# Patient Record
Sex: Male | Born: 1970 | Race: White | Hispanic: No | Marital: Married | Smoking: Current every day smoker
Health system: Southern US, Community
[De-identification: ages and names within clinical notes are randomized; demographics above are authoritative.]

## PROBLEM LIST (undated history)

## (undated) HISTORY — PX: ABDOMINAL SURGERY: SHX537

## (undated) HISTORY — PX: OTHER SURGICAL HISTORY: SHX169

---

## 2015-02-10 ENCOUNTER — Emergency Department
Admission: EM | Admit: 2015-02-10 | Discharge: 2015-02-10 | Disposition: A | Discharge status: Home / Self Care | Payer: Self-pay | Attending: Student | Admitting: Student

## 2015-02-10 ENCOUNTER — Emergency Department: Payer: Self-pay

## 2015-02-10 ENCOUNTER — Encounter: Payer: Self-pay | Admitting: Emergency Medicine

## 2015-02-10 DIAGNOSIS — K029 Dental caries, unspecified: Secondary | ICD-10-CM | POA: Insufficient documentation

## 2015-02-10 DIAGNOSIS — Z720 Tobacco use: Secondary | ICD-10-CM | POA: Insufficient documentation

## 2015-02-10 DIAGNOSIS — I889 Nonspecific lymphadenitis, unspecified: Secondary | ICD-10-CM | POA: Insufficient documentation

## 2015-02-10 DIAGNOSIS — R220 Localized swelling, mass and lump, head: Secondary | ICD-10-CM | POA: Insufficient documentation

## 2015-02-10 LAB — BASIC METABOLIC PANEL
Anion gap: 11 (ref 5–15)
BUN: 19 mg/dL (ref 6–20)
CO2: 23 mmol/L (ref 22–32)
Calcium: 9.1 mg/dL (ref 8.9–10.3)
Chloride: 108 mmol/L (ref 101–111)
Creatinine, Ser: 1.05 mg/dL (ref 0.61–1.24)
GFR calc non Af Amer: >60 mL/min (ref >60)
Glucose, Bld: 99 mg/dL (ref 65–99)
Potassium: 3.7 mmol/L (ref 3.5–5.1)
SODIUM: 142 mmol/L (ref 135–145)

## 2015-02-10 LAB — CBC WITH DIFFERENTIAL/PLATELET
Basophils Absolute: 0.1 10*3/uL (ref 0–0.1)
Basophils Relative: 1 %
EOS ABS: 0.4 10*3/uL (ref 0–0.7)
EOS PCT: 3 %
HCT: 43.8 % (ref 40.0–52.0)
HEMOGLOBIN: 14 g/dL (ref 13.0–18.0)
LYMPHS PCT: 15 %
Lymphs Abs: 1.8 10*3/uL (ref 1.0–3.6)
MCH: 29.8 pg (ref 26.0–34.0)
MCHC: 31.9 g/dL — ABNORMAL LOW (ref 32.0–36.0)
MCV: 93.3 fL (ref 80.0–100.0)
MONO ABS: 0.6 10*3/uL (ref 0.2–1.0)
Monocytes Relative: 5 %
NEUTROS ABS: 9.6 10*3/uL — AB (ref 1.4–6.5)
Neutrophils Relative %: 76 %
Platelets: 224 10*3/uL (ref 150–440)
RBC: 4.7 MIL/uL (ref 4.40–5.90)
RDW: 16 % — AB (ref 11.5–14.5)
WBC: 12.5 10*3/uL — ABNORMAL HIGH (ref 3.8–10.6)

## 2015-02-10 MED ORDER — KETOROLAC TROMETHAMINE 30 MG/ML IJ SOLN
INTRAMUSCULAR | Status: AC
  Filled 2015-02-10: qty 1

## 2015-02-10 MED ORDER — CLINDAMYCIN HCL 300 MG PO CAPS: 300.0000 mg | ORAL_CAPSULE | Freq: Four times a day (QID) | ORAL | Status: AC

## 2015-02-10 MED ORDER — IOHEXOL 300 MG/ML  SOLN
75.0000 mL | Freq: Once | INTRAMUSCULAR | Status: AC | PRN
  Administered 2015-02-10: 75 mL via INTRAVENOUS
  Filled 2015-02-10: qty 75

## 2015-02-10 MED ORDER — KETOROLAC TROMETHAMINE 30 MG/ML IJ SOLN: 30.0000 mg | Freq: Once | INTRAMUSCULAR | Status: DC

## 2015-02-10 MED ORDER — CLINDAMYCIN PHOSPHATE 600 MG/50ML IV SOLN
INTRAVENOUS | Status: AC
  Filled 2015-02-10: qty 50

## 2015-02-10 MED ORDER — CLINDAMYCIN PHOSPHATE 600 MG/50ML IV SOLN: 600.0000 mg | Freq: Once | INTRAVENOUS | Status: DC

## 2015-02-10 NOTE — ED Notes (Signed)
Pt c/o pain to left jaw for the last 4-5 months that has gotten worse.  Knot in front of left ear present; appears as swollen gland.  No weight loss.

## 2015-02-10 NOTE — Discharge Instructions (Signed)
Lymphadenopathy °Lymphadenopathy means "disease of the lymph glands." But the term is usually used to describe swollen or enlarged lymph glands, also called lymph nodes. These are the bean-shaped organs found in many locations including the neck, underarm, and groin. Lymph glands are part of the immune system, which fights infections in your body. Lymphadenopathy can occur in just one area of the body, such as the neck, or it can be generalized, with lymph node enlargement in several areas. The nodes found in the neck are the most common sites of lymphadenopathy. °CAUSES °When your immune system responds to germs (such as viruses or bacteria ), infection-fighting cells and fluid build up. This causes the glands to grow in size. Usually, this is not something to worry about. Sometimes, the glands themselves can become infected and inflamed. This is called lymphadenitis. °Enlarged lymph nodes can be caused by many diseases: °· Bacterial disease, such as strep throat or a skin infection. °· Viral disease, such as a common cold. °· Other germs, such as Lyme disease, tuberculosis, or sexually transmitted diseases. °· Cancers, such as lymphoma (cancer of the lymphatic system) or leukemia (cancer of the white blood cells). °· Inflammatory diseases such as lupus or rheumatoid arthritis. °· Reactions to medications. °Many of the diseases above are rare, but important. This is why you should see your caregiver if you have lymphadenopathy. °SYMPTOMS °· Swollen, enlarged lumps in the neck, back of the head, or other locations. °· Tenderness. °· Warmth or redness of the skin over the lymph nodes. °· Fever. °DIAGNOSIS °Enlarged lymph nodes are often near the source of infection. They can help health care providers diagnose your illness. For instance: °· Swollen lymph nodes around the jaw might be caused by an infection in the mouth. °· Enlarged glands in the neck often signal a throat infection. °· Lymph nodes that are swollen in  more than one area often indicate an illness caused by a virus. °Your caregiver will likely know what is causing your lymphadenopathy after listening to your history and examining you. Blood tests, x-rays, or other tests may be needed. If the cause of the enlarged lymph node cannot be found, and it does not go away by itself, then a biopsy may be needed. Your caregiver will discuss this with you. °TREATMENT °Treatment for your enlarged lymph nodes will depend on the cause. Many times the nodes will shrink to normal size by themselves, with no treatment. Antibiotics or other medicines may be needed for infection. Only take over-the-counter or prescription medicines for pain, discomfort, or fever as directed by your caregiver. °HOME CARE INSTRUCTIONS °Swollen lymph glands usually return to normal when the underlying medical condition goes away. If they persist, contact your health-care provider. He/she might prescribe antibiotics or other treatments, depending on the diagnosis. Take any medications exactly as prescribed. Keep any follow-up appointments made to check on the condition of your enlarged nodes. °SEEK MEDICAL CARE IF: °· Swelling lasts for more than two weeks. °· You have symptoms such as weight loss, night sweats, fatigue, or fever that does not go away. °· The lymph nodes are hard, seem fixed to the skin, or are growing rapidly. °· Skin over the lymph nodes is red and inflamed. This could mean there is an infection. °SEEK IMMEDIATE MEDICAL CARE IF: °· Fluid starts leaking from the area of the enlarged lymph node. °· You develop a fever of 102° F (38.9° C) or greater. °· Severe pain develops (not necessarily at the site of a   large lymph node).  You develop chest pain or shortness of breath.  You develop worsening abdominal pain. MAKE SURE YOU:  Understand these instructions.  Will watch your condition.  Will get help right away if you are not doing well or get worse. Document Released:  06/13/2008 Document Revised: 01/19/2014 Document Reviewed: 06/13/2008 [facility] Patient Information 2015 [place], [place]. This information is not intended to replace advice given to you by your health care provider. Make sure you discuss any questions you have with your health care provider.  Swollen Lymph Nodes The lymphatic system filters fluid from around cells. It is like a system of blood vessels. These channels carry lymph instead of blood. The lymphatic system is an important part of the immune (disease fighting) system. When people talk about "swollen glands in the neck," they are usually talking about swollen lymph nodes. The lymph nodes are like the little traps for infection. You and your caregiver may be able to feel lymph nodes, especially swollen nodes, in these common areas: the groin (inguinal area), armpits (axilla), and above the clavicle (supraclavicular). You may also feel them in the neck (cervical) and the back of the head just above the hairline (occipital). Swollen glands occur when there is any condition in which the body responds with an allergic type of reaction. For instance, the glands in the neck can become swollen from insect bites or any type of minor infection on the head. These are very noticeable in children with only minor problems. Lymph nodes may also become swollen when there is a tumor or problem with the lymphatic system, such as Hodgkin's disease. TREATMENT   Most swollen glands do not require treatment. They can be observed (watched) for a short period of time, if your caregiver feels it is necessary. Most of the time, observation is not necessary.  Antibiotics (medicines that kill germs) may be prescribed by your caregiver. Your caregiver may prescribe these if he or she feels the swollen glands are due to a bacterial (germ) infection. Antibiotics are not used if the swollen glands are caused by a virus. HOME CARE INSTRUCTIONS   Take medications as directed by  your caregiver. Only take over-the-counter or prescription medicines for pain, discomfort, or fever as directed by your caregiver. SEEK MEDICAL CARE IF:   If you begin to run a temperature greater than 102 F (38.9 C), or as your caregiver suggests. MAKE SURE YOU:   Understand these instructions.  Will watch your condition.  Will get help right away if you are not doing well or get worse. Document Released: 08/25/2002 Document Revised: 11/27/2011 Document Reviewed: 09/04/2005 [facility] Patient Information 2015 [place]Care, [place]. This information is not intended to replace advice given to you by your health care provider. Make sure you discuss any questions you have with your health care provider.  Your exam, labs, and CT scan were essentially normal. The scan did show a single, infected lymph node at the left ear.  Take the antibiotic as directed, until completely gone.  Apply warm compresses to promote healing. Take OTC Tylenol or Motrin as needed for pain.  Follow-up with your provider or one of the clinics listed for ongoing care and routine medical care.

## 2015-02-10 NOTE — ED Provider Notes (Signed)
[facility] Emergency Department Provider Note ?____________________________________________ ? Time seen: 1545 ? I have reviewed the triage vital signs and the nursing notes. ________ HISTORY ? Chief Complaint Facial Pain  HPI  Vernon Collins is a 44 y.o. male was to the ED with his wife for evaluation of a chronic left facial mass. He describes swelling overlying the left TMJ, and in front of the left ear, that he has had for at least 6 months. He notes in the last week, there has been increased tenderness to touch and increased tightness to the same mass. He denies any direct trauma, fever, chills, sweats or spontaneous drainage. He denies any recent dental work, dental infection, or jaw dysfunction.  History reviewed. No pertinent past medical history.  There are no active problems to display for this patient. ? Past Surgical History  Procedure Laterality Date  . Abdominal surgery    . Arm surgery     Current Outpatient Rx  Name  Route  Sig  Dispense  Refill  . clindamycin (CLEOCIN) 300 MG capsule   Oral   Take 1 capsule (300 mg total) by mouth 4 (four) times daily.   40 capsule   0   ? Allergies Review of patient's allergies indicates no known allergies. ? History reviewed. No pertinent family history. ? Social History History  Substance Use Topics  . Smoking status: Current Every Day Smoker  . Smokeless tobacco: Not on file  . Alcohol Use: Yes   Review of Systems Constitutional: Negative for fever. HEENT:  Normocephalic/atraumatic. Negative for visual/hearingchanges, sore throat, or nasal congestion.   Cardiovascular: Negative for chest pain. Respiratory: Negative for shortness of breath. Musculoskeletal: Negative for back pain. Skin: No rash, abscess, or boil. Neurological: Negative for headaches, focal weakness or numbness. Hematological/Lymphatic:Negative for enlarged lymph nodes  10-point ROS otherwise  negative. ____________________________________________  PHYSICAL EXAM:  VITAL SIGNS: ED Triage Vitals  Enc Vitals Group     BP 02/10/15 1530 131/82 mmHg     Pulse Rate 02/10/15 1530 106     Resp 02/10/15 1530 16     Temp 02/10/15 1530 97.8 F (36.6 C)     Temp Source 02/10/15 1530 Oral     SpO2 02/10/15 1530 96 %     Weight 02/10/15 1530 210 lb (95.255 kg)     Height 02/10/15 1530 5\' 10"  (1.778 m)     Head Cir --      Peak Flow --      Pain Score 02/10/15 1533 6     Pain Loc --      Pain Edu? --      Excl. in GC? --    Constitutional: Alert and oriented. Well appearing and in no distress. HEENT: Normocephalic and atraumatic.Conjunctivae are normal. PERRL. Normal extraocular movements. Mucous membranes are moist. There is noted be a firm, cystic mass in the region of the left preauricular node. It feels to measure about 4 cm in induration. The overlying skin is without erythema, warmth, fluctuance, or pointing abscess.  Hematological/Lymphatic/Immunilogical: No cervical lymphadenopathy. Questionable inflamed left preauricular node on exam versus other chronic soft tissue mass.  Cardiovascular: Normal rate, regular rhythm.No murmurs, rubs, or gallops.  Respiratory: Normal respiratory effort. Breath sounds are clear and equal bilaterally. No wheezes/rales/rhonchi. Musculoskeletal: Nontender with normal range of motion in all extremities.  Neurologic:  Normal speech and language. No gross focal neurologic deficits are appreciated.  Skin:  Warm and dry. No lesion, erythema, or drainage from left face.  Psychiatric: Mood, affect, and behavior are normal. Patient exhibits appropriate insight and judgment. _____ LABS  Labs Reviewed  CBC WITH DIFFERENTIAL/PLATELET - Abnormal; Notable for the following:    WBC 12.5 (*)    MCHC 31.9 (*)    RDW 16.0 (*)    Neutro Abs 9.6 (*)    All other components within normal limits  BASIC METABOLIC PANEL  ____________ CT SCAN Maxillofacial w/  Contrast  IMPRESSION: 1. Enlarged, enhancing left preauricular lymph node with associated edema/inflammation in the subcutaneous fat adjacent to the node. Infection is favored over malignancy (lymphadenitis). 2. No evidence of pathologic lymphadenopathy elsewhere. 3. 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 4. Dental disease as detailed above. ____________ PROCEDURES ? Procedure(s) performed: Clindamycin 600 mg IVPB x 1             Toradol 30 mg IVP  Critical Care performed: None ______________________________________________________ INITIAL IMPRESSION / ASSESSMENT AND PLAN / ED COURSE ? Inflamed left preauricular node versus undetermined soft tissue mass or cyst. Radiology and lab results to patient and wife. CT confirms infected/inflamed left lymph node. Reassurance to patient about clindamycin treatment and course.  Follow-up with local community clinic for general medical care. Return to the ED as needed for continued symptoms.   Pertinent labs & imaging results that were available during my care of the patient were reviewed by me and considered in my medical decision making (see chart for details).  ____________________________________________ FINAL CLINICAL IMPRESSION(S) / ED DIAGNOSES?  Final diagnoses:  Facial mass  Preauricular lymphadenitis  Dental caries      Lissa Hoard, PA-C 02/10/15 1821  Gayla Doss, MD 02/10/15 2238

## 2016-07-03 IMAGING — CT CT MAXILLOFACIAL W/ CM
3 of 4 series · 15 of 47 positions shown, 18 images · IV contrast (omnipaque)
Comparison: None.

CLINICAL DATA: Left jaw pain over the past 4-5 months that has
progressively worsened. Palpable lump anterior to the left year.

EXAM:
CT MAXILLOFACIAL WITH CONTRAST
TECHNIQUE: Multidetector CT imaging of the maxillofacial structures was
performed with intravenous contrast. Multiplanar CT image
reconstructions were also generated. A small metallic BB was placed
on the right temple in order to reliably differentiate right from
left.
CONTRAST:  75mL OMNIPAQUE IOHEXOL 300 MG/ML IV.

[Series 2: max soft · axial · 0.38mm/px · z∈[-166,-16]mm · 10 of 89 slices shown, 13 images]
[im 7/89  brain]
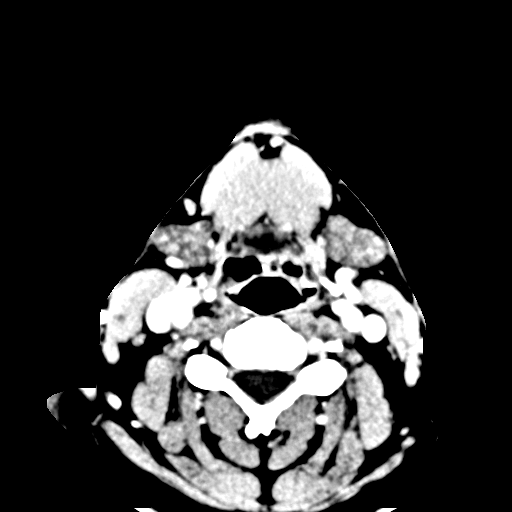
[im 7/89  bone]
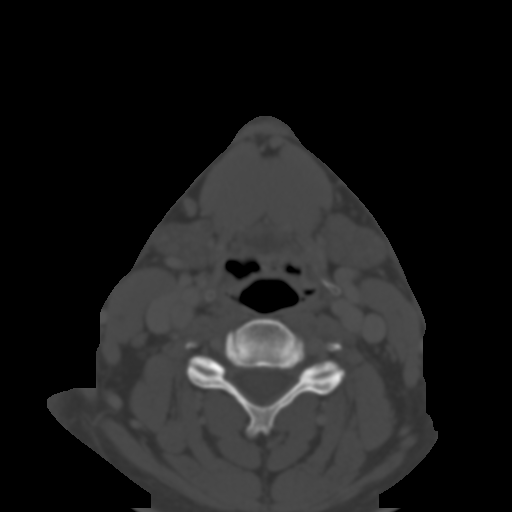
[im 16/89  bone]
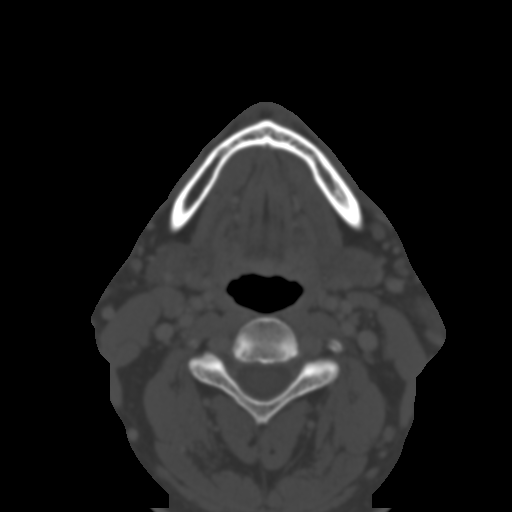
[im 25/89  bone]
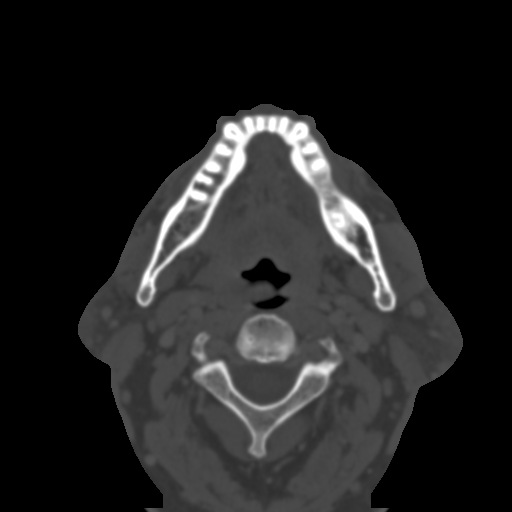
[im 31/89  bone]
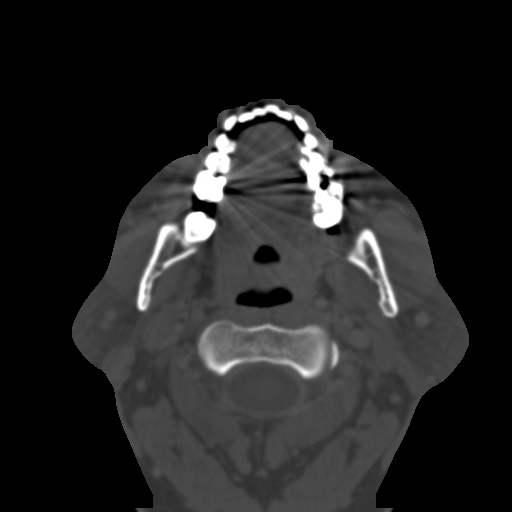
[im 40/89  brain]
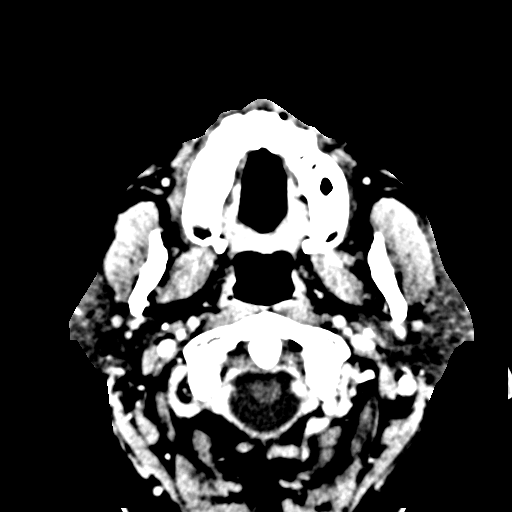
[im 40/89  bone]
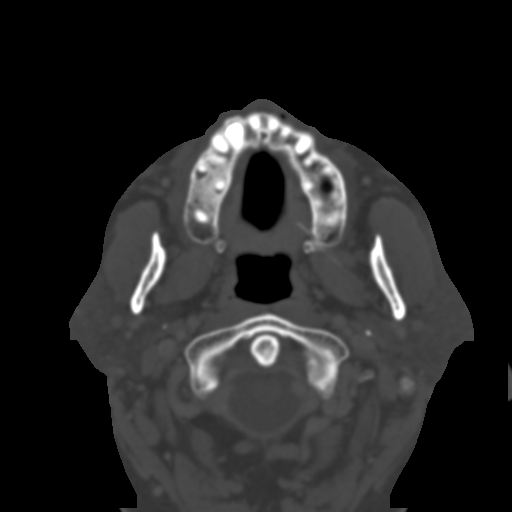
[im 49/89  bone]
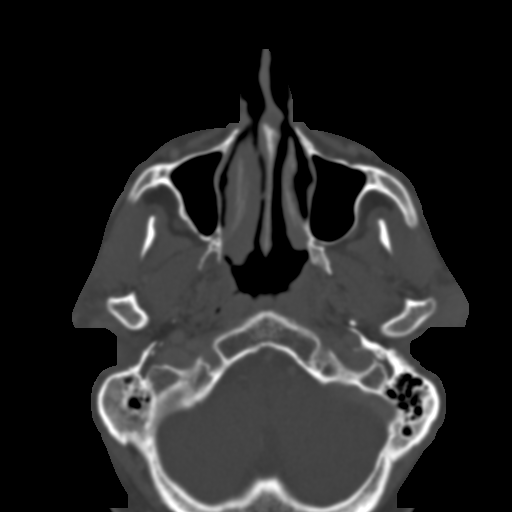
[im 58/89  bone]
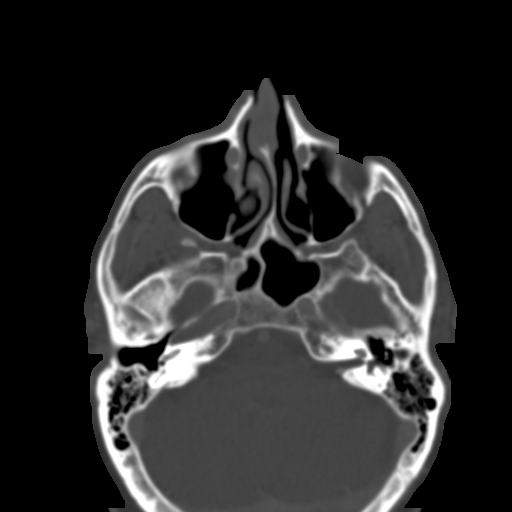
[im 67/89  bone]
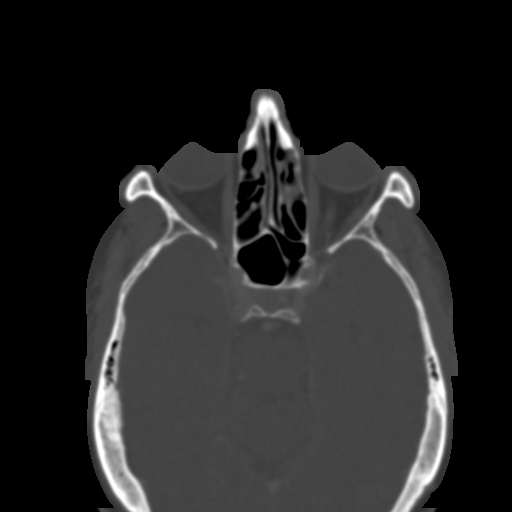
[im 73/89  brain]
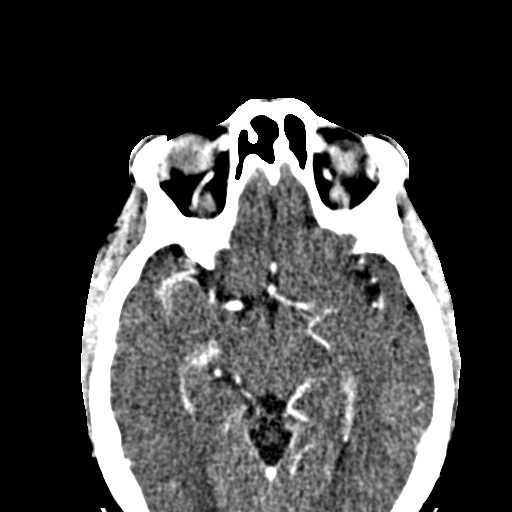
[im 73/89  bone]
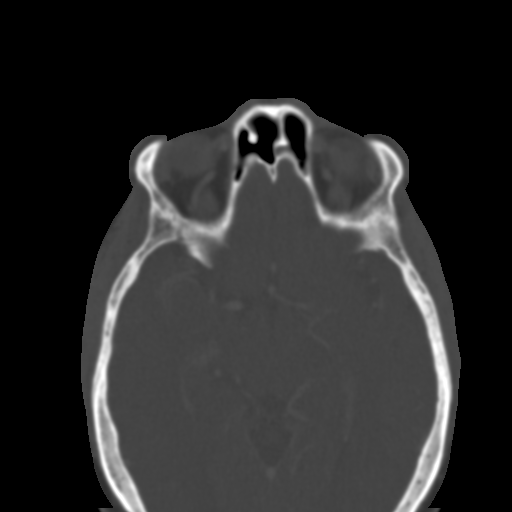
[im 82/89  bone]
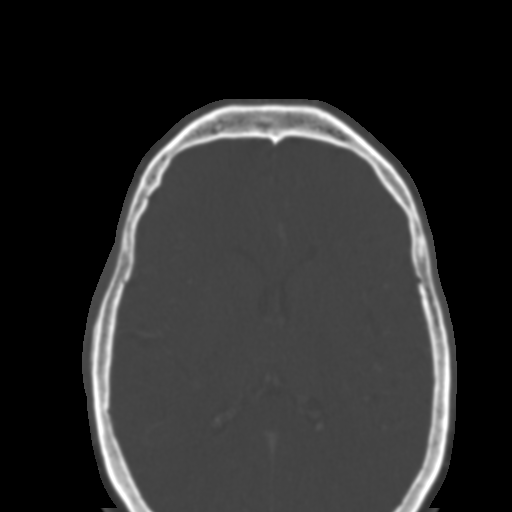

[Series 4: coronal soft · coronal · 0.37mm/px · 3 of 88 slices shown]
[im 30/88  bone]
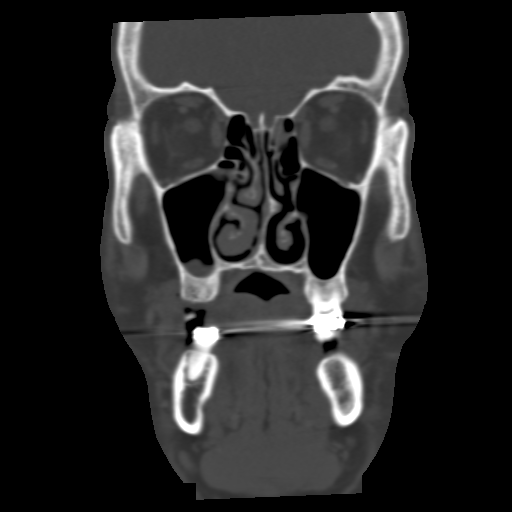
[im 39/88  bone]
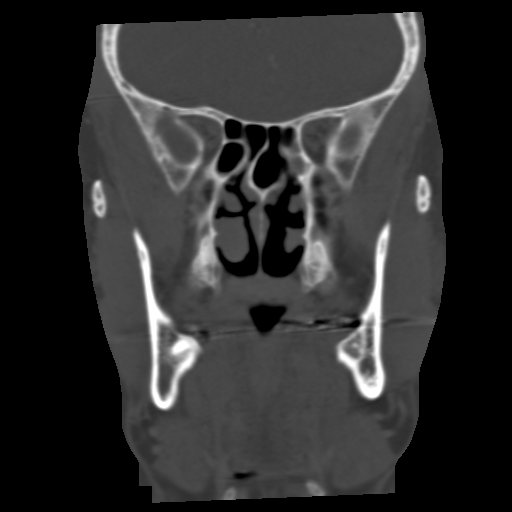
[im 49/88  bone]
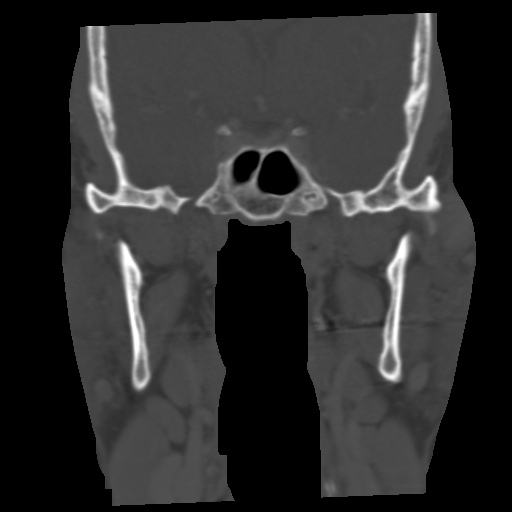

[Series 7: sagittal bone · sagittal · 0.37mm/px · 2 of 87 slices shown]
[im 29/87  bone]
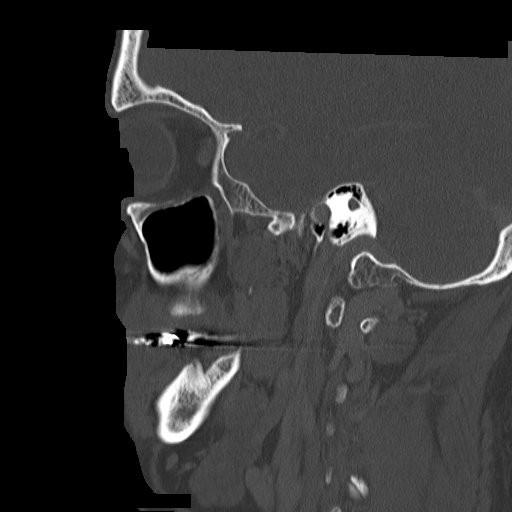
[im 58/87  bone]
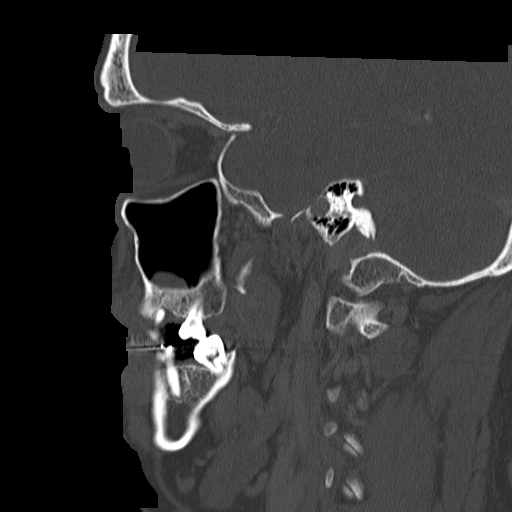

[15 of 47 positions shown; findings below may reference images not displayed]

FINDINGS: Enlarged, enhancing left preauricular lymph node with
edema/inflammation immediately adjacent to the node. The node is
immediately adjacent to the anterior superior portion of the left
parotid gland, but appears separate from the gland. No pathologic
lymphadenopathy elsewhere in the visualized facial soft tissues.

Normal appearing symmetric parotid and submandibular salivary
glands. Nasopharynx and visualized oropharynx unremarkable. No
evidence of jugular venous thrombosis. Orbits and globes intact.

Small mucous retention cysts or polyps in the maxillary sinuses and
the left sphenoid sinus. Opacification of scattered bilateral
ethmoid air cells. Right sphenoid sinus and both frontal sinuses
well aerated. Bilateral mastoid air cells and middle ear cavities
well aerated. Bony nasal septal deviation to the left.

Temporomandibular joints intact. Dental Ferienhaus involving the 1st
molar in the right side of the maxilla (tooth # 3) with mild
periapical lucency involving the roots of this tooth. Unerupted
lateral incisor of the right side of the mandible (tooth # 7).
IMPRESSION: 1. Enlarged, enhancing left preauricular lymph node with associated
edema/inflammation in the subcutaneous fat adjacent to the node.
Infection is favored over malignancy (lymphadenitis).
2. No evidence of pathologic lymphadenopathy elsewhere.
3. Mild chronic bilateral maxillary, bilateral ethmoid and left
sphenoid sinus disease.
4. Dental disease as detailed above.
